# Patient Record
Sex: Male | Born: 1995 | Race: White | Hispanic: No | Marital: Married | Smoking: Current every day smoker
Health system: Southern US, Community
[De-identification: ages and names within clinical notes are randomized; demographics above are authoritative.]

---

## 2016-10-25 ENCOUNTER — Emergency Department (HOSPITAL_COMMUNITY)
Admission: EM | Admit: 2016-10-25 | Discharge: 2016-10-25 | Disposition: A | Discharge status: Home / Self Care | Payer: Commercial Managed Care - PPO | Attending: Emergency Medicine | Admitting: Emergency Medicine

## 2016-10-25 ENCOUNTER — Encounter (HOSPITAL_COMMUNITY): Payer: Self-pay | Admitting: Emergency Medicine

## 2016-10-25 ENCOUNTER — Emergency Department (HOSPITAL_COMMUNITY): Payer: Commercial Managed Care - PPO

## 2016-10-25 DIAGNOSIS — R1031 Right lower quadrant pain: Secondary | ICD-10-CM | POA: Diagnosis present

## 2016-10-25 DIAGNOSIS — F17200 Nicotine dependence, unspecified, uncomplicated: Secondary | ICD-10-CM | POA: Diagnosis not present

## 2016-10-25 DIAGNOSIS — R1033 Periumbilical pain: Secondary | ICD-10-CM | POA: Diagnosis not present

## 2016-10-25 LAB — COMPREHENSIVE METABOLIC PANEL
ALBUMIN: 4.5 g/dL (ref 3.5–5.0)
ALK PHOS: 37 U/L — AB (ref 38–126)
ALT: 10 U/L — AB (ref 17–63)
AST: 20 U/L (ref 15–41)
Anion gap: 7 (ref 5–15)
BUN: 8 mg/dL (ref 6–20)
CALCIUM: 9.3 mg/dL (ref 8.9–10.3)
CO2: 27 mmol/L (ref 22–32)
CREATININE: 0.79 mg/dL (ref 0.61–1.24)
Chloride: 106 mmol/L (ref 101–111)
GFR calc Af Amer: >60 mL/min (ref >60)
GFR calc non Af Amer: >60 mL/min (ref >60)
GLUCOSE: 91 mg/dL (ref 65–99)
Potassium: 4.2 mmol/L (ref 3.5–5.1)
SODIUM: 140 mmol/L (ref 135–145)
Total Bilirubin: 0.8 mg/dL (ref 0.3–1.2)
Total Protein: 6.8 g/dL (ref 6.5–8.1)

## 2016-10-25 LAB — URINALYSIS, ROUTINE W REFLEX MICROSCOPIC
BILIRUBIN URINE: NEGATIVE
Glucose, UA: NEGATIVE mg/dL
HGB URINE DIPSTICK: NEGATIVE
Ketones, ur: NEGATIVE mg/dL
Leukocytes, UA: NEGATIVE
Nitrite: NEGATIVE
PROTEIN: NEGATIVE mg/dL
SPECIFIC GRAVITY, URINE: 1.016 (ref 1.005–1.030)
pH: 6.5 (ref 5.0–8.0)

## 2016-10-25 LAB — LIPASE, BLOOD: Lipase: 35 U/L (ref 11–51)

## 2016-10-25 LAB — CBC
HCT: 43.7 % (ref 39.0–52.0)
Hemoglobin: 14.9 g/dL (ref 13.0–17.0)
MCH: 29.5 pg (ref 26.0–34.0)
MCHC: 34.1 g/dL (ref 30.0–36.0)
MCV: 86.5 fL (ref 78.0–100.0)
PLATELETS: 181 10*3/uL (ref 150–400)
RBC: 5.05 MIL/uL (ref 4.22–5.81)
RDW: 12.4 % (ref 11.5–15.5)
WBC: 5.7 10*3/uL (ref 4.0–10.5)

## 2016-10-25 MED ORDER — IBUPROFEN 600 MG PO TABS: 600.0000 mg | ORAL_TABLET | Freq: Four times a day (QID) | ORAL | 0 refills | Status: AC | PRN

## 2016-10-25 MED ORDER — IOPAMIDOL (ISOVUE-300) INJECTION 61%
INTRAVENOUS | Status: AC
  Filled 2016-10-25: qty 30

## 2016-10-25 MED ORDER — IOPAMIDOL (ISOVUE-300) INJECTION 61%
INTRAVENOUS | Status: AC
  Administered 2016-10-25: 100 mL
  Filled 2016-10-25: qty 100

## 2016-10-25 MED ORDER — SULFAMETHOXAZOLE-TRIMETHOPRIM 800-160 MG PO TABS: 1.0000 | ORAL_TABLET | Freq: Two times a day (BID) | ORAL | 0 refills | Status: AC

## 2016-10-25 NOTE — Discharge Instructions (Signed)
Your testing today showed that you had an abnormal appearing prostate - this could be related to infection in the prostate - you should follow up closely with the Urologist for ongoing treatment of this condition - I have given you the phone number for this practice - call in the morning to make a next available appointment.  Until then, please start taking Bactrim DS - 1 tablet by mouth twice daily.  This will be for at least 3 weeks - the Urologist will add more time on if they feel necessary.    If you should have rashes, blistering or vomiting please stop taking the medicine and call your doctor immediately.  Please share your results with your family doctor as well.

## 2016-10-25 NOTE — ED Triage Notes (Signed)
pt report RLQ pain x 3days. Worse with movement denies any n/v/d.

## 2016-10-25 NOTE — ED Provider Notes (Signed)
[facility]-EMERGENCY DEPT Provider Note   CSN: [id number] Arrival date & time: 10/25/16  1614     History   Chief Complaint Chief Complaint  Patient presents with  . Abdominal Pain    HPI Austin Costa is a 20 y.o. male.  HPI  The patient is a 20 year old male with no significant past medical or past surgical history who presents with right lower quadrant pain, he actually states that the pain is right periumbilical pain and has been present for 4 days, it seems to get worse when he moves around, it is not worse with having a bowel movement or urinating and there is no associated fevers chills nausea or vomiting.  The patient reports that he does have a history of chlamydia before he got married, he was successfully treated and has been in a monogamous relationship for 2 years, he has no penile discharge or drainage, he often has some discomfort in the perineal area after ejaculation  History reviewed. No pertinent past medical history.  There are no active problems to display for this patient.   History reviewed. No pertinent surgical history.     Home Medications    Prior to Admission medications   Medication Sig Start Date End Date Taking? Authorizing Provider  ibuprofen (ADVIL,MOTRIN) 600 MG tablet Take 1 tablet (600 mg total) by mouth every 6 (six) hours as needed. 10/25/16   Eber HongBrian Alzada Brazee, MD  sulfamethoxazole-trimethoprim (BACTRIM DS,SEPTRA DS) 800-160 MG tablet Take 1 tablet by mouth 2 (two) times daily. 10/25/16 11/15/16  Eber HongBrian Kess Mcilwain, MD    Family History No family history on file.  Social History Social History  Substance Use Topics  . Smoking status: Current Every Day Smoker    Packs/day: 0.50  . Smokeless tobacco: Never Used  . Alcohol use No     Allergies   Patient has no known allergies.   Review of Systems Review of Systems  All other systems reviewed and are negative.    Physical Exam Updated Vital Signs BP 132/75 (BP Location: Right Arm)    Pulse 69   Temp 98.3 F (36.8 C) (Oral)   Resp 17   SpO2 99%   Physical Exam  Constitutional: He appears well-developed and well-nourished. No distress.  HENT:  Head: Normocephalic and atraumatic.  Mouth/Throat: Oropharynx is clear and moist. No oropharyngeal exudate.  Eyes: Conjunctivae and EOM are normal. Pupils are equal, round, and reactive to light. Right eye exhibits no discharge. Left eye exhibits no discharge. No scleral icterus.  Neck: Normal range of motion. Neck supple. No JVD present. No thyromegaly present.  Cardiovascular: Normal rate, regular rhythm, normal heart sounds and intact distal pulses.  Exam reveals no gallop and no friction rub.   No murmur heard. Pulmonary/Chest: Effort normal and breath sounds normal. No respiratory distress. He has no wheezes. He has no rales.  Abdominal: Soft. Bowel sounds are normal. He exhibits no distension and no mass. There is tenderness ( Right periumbilical tenderness is present, + carnett's sign).  Genitourinary:  Genitourinary Comments: Rectal exam / prostate exam - no sig ttp over the prostate - feels smooth.  No masses, no fissues, no hemorrhoids, no stool in rectal vault  Musculoskeletal: Normal range of motion. He exhibits no edema or tenderness.  Lymphadenopathy:    He has no cervical adenopathy.  Neurological: He is alert. Coordination normal.  Skin: Skin is warm and dry. No rash noted. No erythema.  Psychiatric: He has a normal mood and affect. His behavior is normal.  Nursing note and vitals reviewed.    ED Treatments / Results  Labs (all labs ordered are listed, but only abnormal results are displayed) Labs Reviewed  COMPREHENSIVE METABOLIC PANEL - Abnormal; Notable for the following:       Result Value   ALT 10 (*)    Alkaline Phosphatase 37 (*)    All other components within normal limits  URINE CULTURE  LIPASE, BLOOD  CBC  URINALYSIS, ROUTINE W REFLEX MICROSCOPIC (NOT AT [facility])   Radiology Ct Abdomen  Pelvis W Contrast  Result Date: 10/25/2016 CLINICAL DATA:  RIGHT lower quadrant pain for 3 days, worse with movement. EXAM: CT ABDOMEN AND PELVIS WITH CONTRAST TECHNIQUE: Multidetector CT imaging of the abdomen and pelvis was performed using the standard protocol following bolus administration of intravenous contrast. CONTRAST:  100mL ISOVUE-300 IOPAMIDOL (ISOVUE-300) INJECTION 61% COMPARISON:  None. FINDINGS: LOWER CHEST: Lung bases are clear. Included heart size is normal. No pericardial effusion. Gas within distal esophagus associated with reflux. HEPATOBILIARY: Liver and gallbladder are normal. PANCREAS: Normal. SPLEEN: Normal. ADRENALS/URINARY TRACT: Kidneys are orthotopic, demonstrating symmetric enhancement. No nephrolithiasis, hydronephrosis or solid renal masses. The unopacified ureters are normal in course and caliber. Urinary bladder is partially distended and unremarkable. Normal adrenal glands. STOMACH/BOWEL: The stomach, small and large bowel are normal in course and caliber without inflammatory changes. Mild amount of retained large bowel stool. Normal appendix. VASCULAR/LYMPHATIC: Aortoiliac vessels are normal in course and caliber. No lymphadenopathy by CT size criteria. REPRODUCTIVE: Prostate is mildly enlarged, with heterogeneous enhancement. Mild symmetrically edematous appearing seminal vesicles. OTHER: No intraperitoneal free fluid or free air. MUSCULOSKELETAL: Nonacute. IMPRESSION: Enlarged heterogeneous prostate concerning for prostatitis and probable seminal vesiculitis. Normal appendix. Electronically Signed   By: Awilda Metroourtnay  Bloomer M.D.   On: 10/25/2016 19:55    Procedures Procedures (including critical care time)  Medications Ordered in ED Medications  iopamidol (ISOVUE-300) 61 % injection (not administered)  iopamidol (ISOVUE-300) 61 % injection (100 mLs  Contrast Given 10/25/16 1934)     Initial Impression / Assessment and Plan / ED Course  I have reviewed the triage  vital signs and the nursing notes.  Pertinent labs & imaging results that were available during my care of the patient were reviewed by me and considered in my medical decision making (see chart for details).  Clinical Course     Carnett's sign, but has R periumbilical ttp - has no RLQ ttp and no hernias - otherwise well - denies any recent strains or injuries - will check for appy and r/o.  CT shows abnormal prostate - no other acute findings Bactrim, motrin F/u with Urology Pt in agreement.  Final Clinical Impressions(s) / ED Diagnoses   Final diagnoses:  Periumbilical abdominal pain    New Prescriptions New Prescriptions   IBUPROFEN (ADVIL,MOTRIN) 600 MG TABLET    Take 1 tablet (600 mg total) by mouth every 6 (six) hours as needed.   SULFAMETHOXAZOLE-TRIMETHOPRIM (BACTRIM DS,SEPTRA DS) 800-160 MG TABLET    Take 1 tablet by mouth 2 (two) times daily.     Eber HongBrian Tiberius Loftus, MD 10/25/16 2028

## 2016-10-27 LAB — URINE CULTURE: Culture: NO GROWTH

## 2018-07-13 IMAGING — CT CT ABD-PELV W/ CM
2 of 4 series · 16 of 46 positions shown, 18 images · IV contrast (Omni 300)
Comparison: None.

CLINICAL DATA: RIGHT lower quadrant pain for 3 days, worse with
movement.

EXAM:
CT ABDOMEN AND PELVIS WITH CONTRAST
TECHNIQUE: Multidetector CT imaging of the abdomen and pelvis was performed
using the standard protocol following bolus administration of
intravenous contrast.
CONTRAST:  100mL R1ZK7H-CII IOPAMIDOL (R1ZK7H-CII) INJECTION 61%

[Series 2: a/p w/ 5mm · axial · 0.72mm/px · z∈[+693,+1123]mm · 13 of 94 slices shown, 15 images]
[im 4/94  soft-tissue]
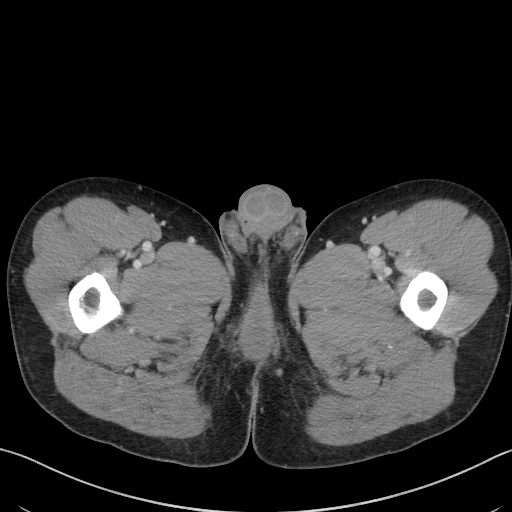
[im 4/94  bone]
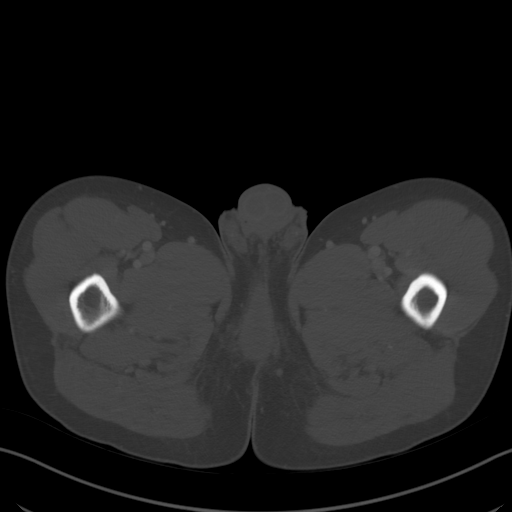
[im 12/94  soft-tissue]
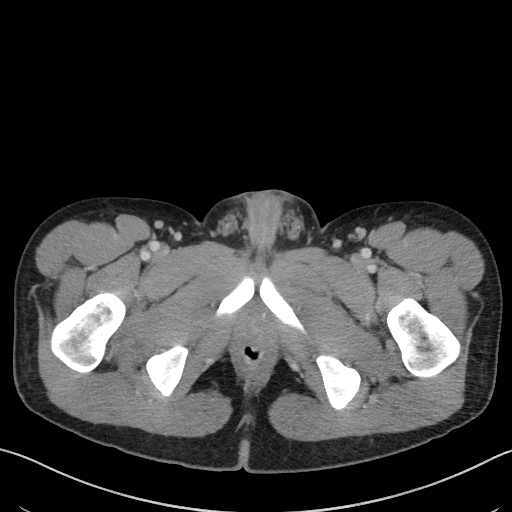
[im 20/94  soft-tissue]
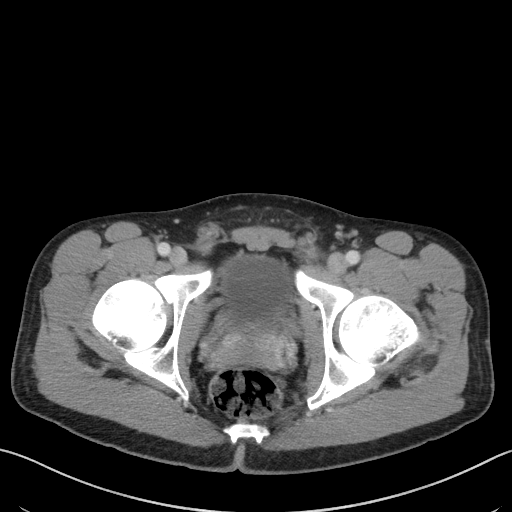
[im 28/94  soft-tissue]
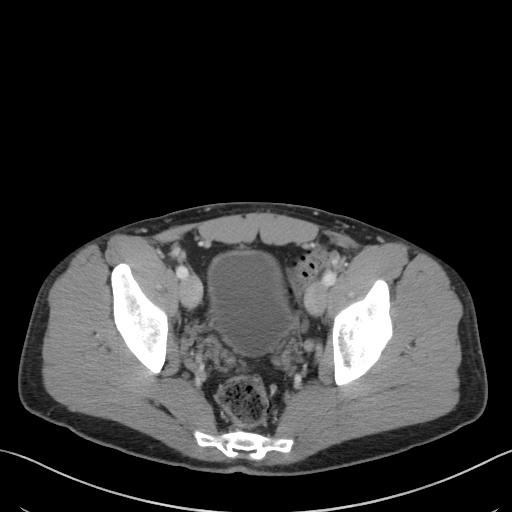
[im 32/94  soft-tissue]
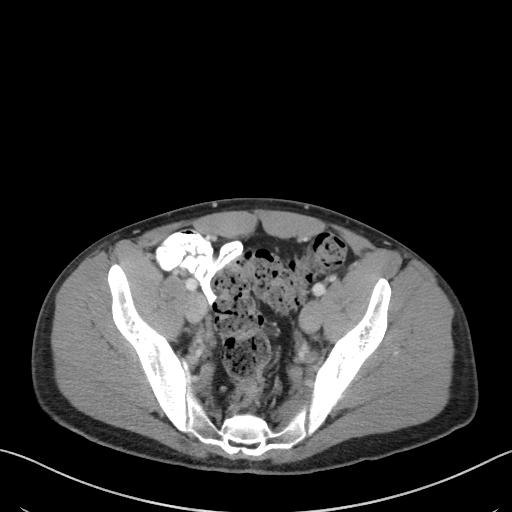
[im 39/94  soft-tissue]
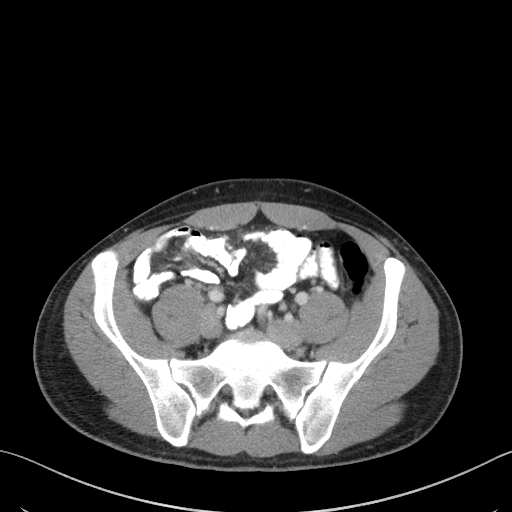
[im 47/94  soft-tissue]
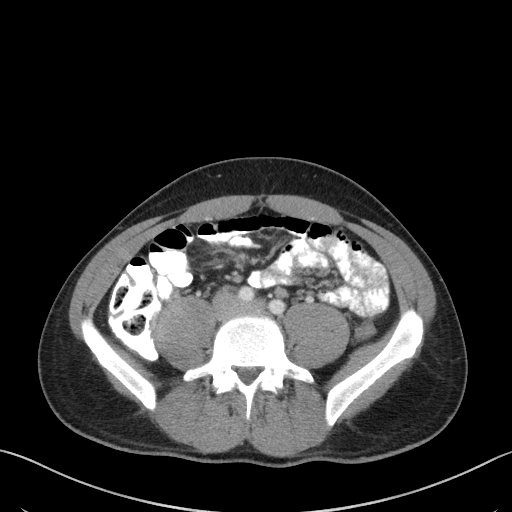
[im 55/94  soft-tissue]
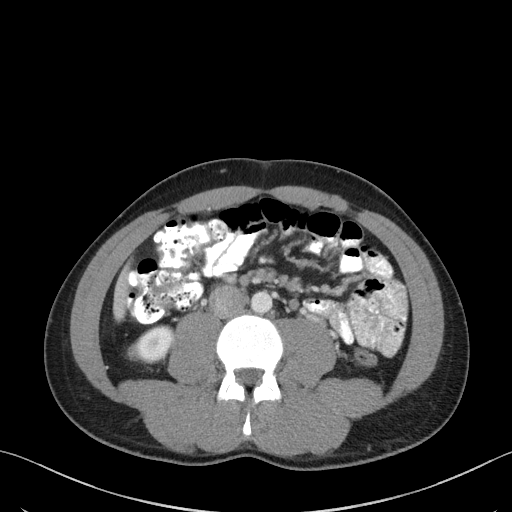
[im 63/94  soft-tissue]
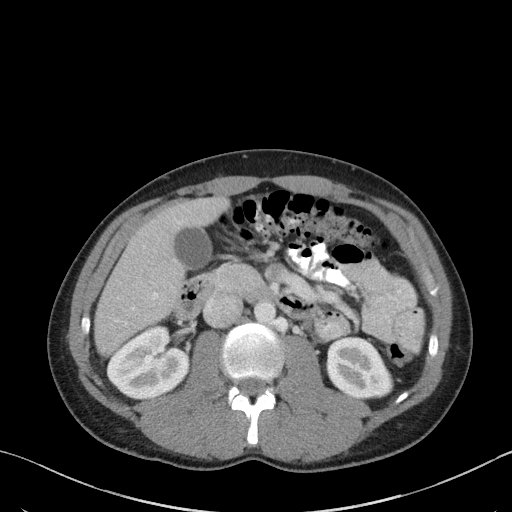
[im 63/94  bone]
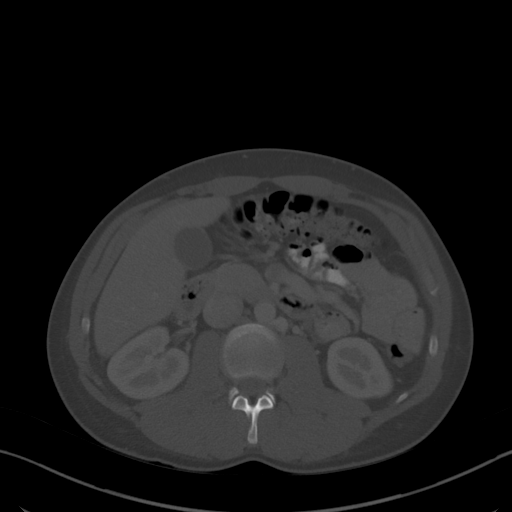
[im 66/94  soft-tissue]
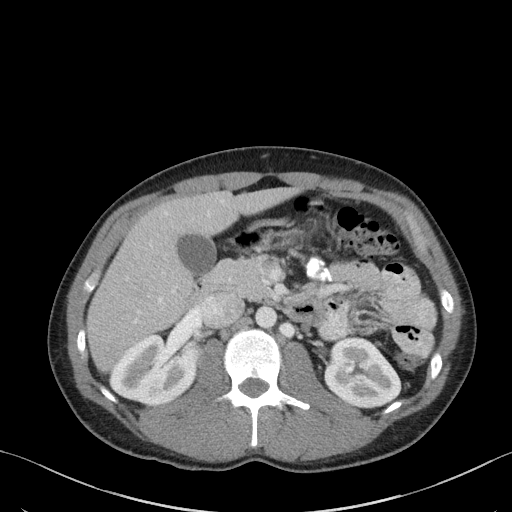
[im 74/94  soft-tissue]
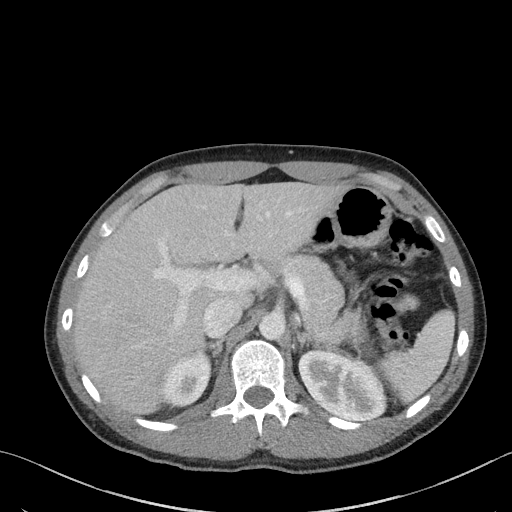
[im 82/94  soft-tissue]
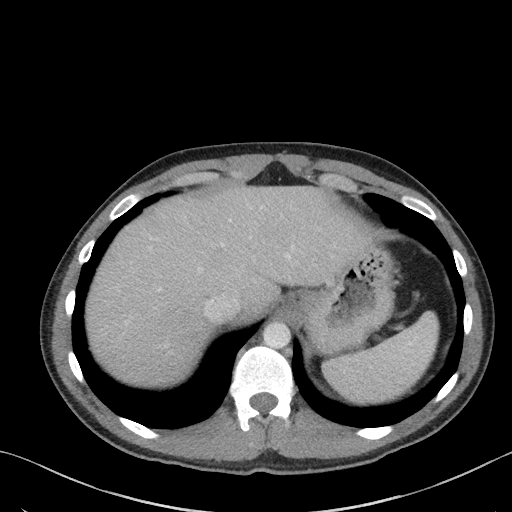
[im 90/94  soft-tissue]
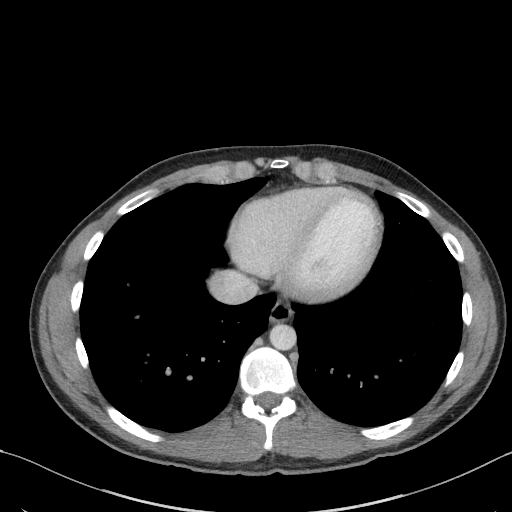

[Series 5: a/p w/ cor · coronal · 0.64mm/px · 3 of 115 slices shown]
[im 39/115  soft-tissue]
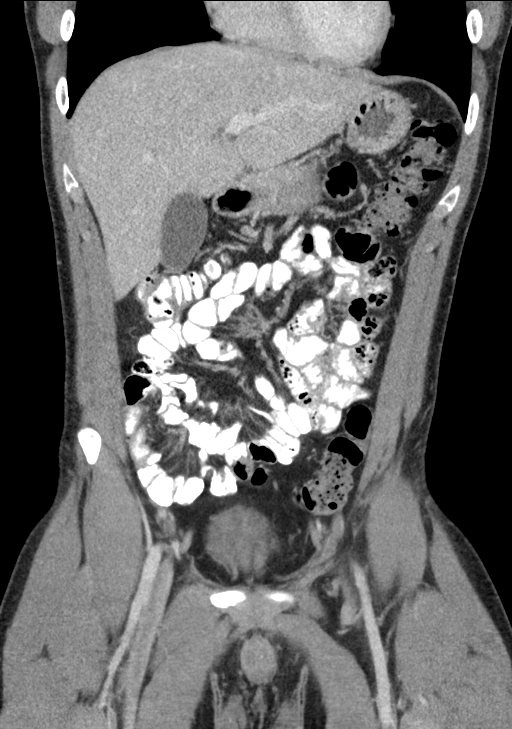
[im 51/115  soft-tissue]
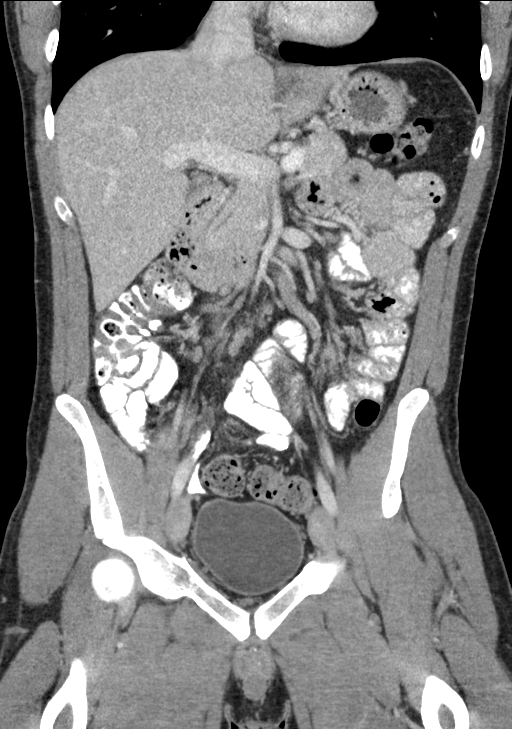
[im 64/115  soft-tissue]
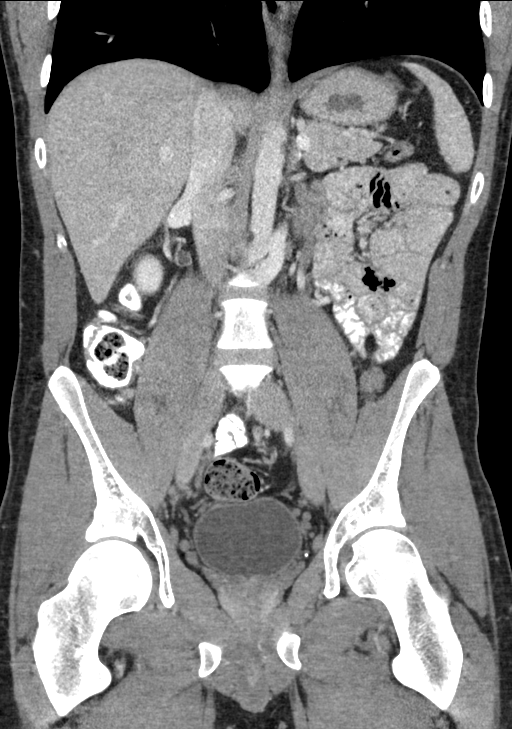

[16 of 46 positions shown; findings below may reference images not displayed]

FINDINGS: LOWER CHEST: Lung bases are clear. Included heart size is normal. No
pericardial effusion. Gas within distal esophagus associated with
reflux.

HEPATOBILIARY: Liver and gallbladder are normal.

PANCREAS: Normal.

SPLEEN: Normal.

ADRENALS/URINARY TRACT: Kidneys are orthotopic, demonstrating
symmetric enhancement. No nephrolithiasis, hydronephrosis or solid
renal masses. The unopacified ureters are normal in course and
caliber. Urinary bladder is partially distended and unremarkable.
Normal adrenal glands.

STOMACH/BOWEL: The stomach, small and large bowel are normal in
course and caliber without inflammatory changes. Mild amount of
retained large bowel stool. Normal appendix.

VASCULAR/LYMPHATIC: Aortoiliac vessels are normal in course and
caliber. No lymphadenopathy by CT size criteria.

REPRODUCTIVE: Prostate is mildly enlarged, with heterogeneous
enhancement. Mild symmetrically edematous appearing seminal
vesicles.

OTHER: No intraperitoneal free fluid or free air.

MUSCULOSKELETAL: Nonacute.
IMPRESSION: Enlarged heterogeneous prostate concerning for prostatitis and
probable seminal vesiculitis.

Normal appendix.
